# Patient Record
Sex: Male | Born: 1955 | Race: White | Hispanic: No | Marital: Married | Smoking: Former smoker
Health system: Southern US, Community
[De-identification: ages and names within clinical notes are randomized; demographics above are authoritative.]

## PROBLEM LIST (undated history)

## (undated) DIAGNOSIS — E8770 Fluid overload, unspecified: Secondary | ICD-10-CM

## (undated) DIAGNOSIS — I4891 Unspecified atrial fibrillation: Secondary | ICD-10-CM

## (undated) DIAGNOSIS — N289 Disorder of kidney and ureter, unspecified: Secondary | ICD-10-CM

## (undated) DIAGNOSIS — I10 Essential (primary) hypertension: Secondary | ICD-10-CM

## (undated) DIAGNOSIS — N186 End stage renal disease: Secondary | ICD-10-CM

## (undated) DIAGNOSIS — E039 Hypothyroidism, unspecified: Secondary | ICD-10-CM

## (undated) DIAGNOSIS — Z992 Dependence on renal dialysis: Secondary | ICD-10-CM

## (undated) HISTORY — PX: OTHER SURGICAL HISTORY: SHX169

## (undated) HISTORY — PX: APPENDECTOMY: SHX54

## (undated) HISTORY — PX: TONSILLECTOMY: SUR1361

---

## 2017-11-28 DIAGNOSIS — E8770 Fluid overload, unspecified: Secondary | ICD-10-CM

## 2017-11-28 HISTORY — DX: Fluid overload, unspecified: E87.70

## 2017-12-21 ENCOUNTER — Encounter (HOSPITAL_COMMUNITY): Payer: Self-pay | Admitting: Emergency Medicine

## 2017-12-21 ENCOUNTER — Observation Stay (HOSPITAL_COMMUNITY)
Admission: EM | Admit: 2017-12-21 | Discharge: 2017-12-23 | Disposition: A | Discharge status: Home / Self Care | Payer: Medicare Other | Attending: Internal Medicine | Admitting: Internal Medicine

## 2017-12-21 ENCOUNTER — Emergency Department (HOSPITAL_COMMUNITY): Payer: Medicare Other

## 2017-12-21 ENCOUNTER — Other Ambulatory Visit: Payer: Self-pay

## 2017-12-21 DIAGNOSIS — J811 Chronic pulmonary edema: Secondary | ICD-10-CM | POA: Insufficient documentation

## 2017-12-21 DIAGNOSIS — N400 Enlarged prostate without lower urinary tract symptoms: Secondary | ICD-10-CM | POA: Diagnosis not present

## 2017-12-21 DIAGNOSIS — Z79899 Other long term (current) drug therapy: Secondary | ICD-10-CM | POA: Insufficient documentation

## 2017-12-21 DIAGNOSIS — E8770 Fluid overload, unspecified: Secondary | ICD-10-CM | POA: Diagnosis present

## 2017-12-21 DIAGNOSIS — J9601 Acute respiratory failure with hypoxia: Secondary | ICD-10-CM | POA: Diagnosis not present

## 2017-12-21 DIAGNOSIS — I120 Hypertensive chronic kidney disease with stage 5 chronic kidney disease or end stage renal disease: Secondary | ICD-10-CM | POA: Diagnosis not present

## 2017-12-21 DIAGNOSIS — N186 End stage renal disease: Secondary | ICD-10-CM

## 2017-12-21 DIAGNOSIS — I10 Essential (primary) hypertension: Secondary | ICD-10-CM | POA: Diagnosis present

## 2017-12-21 DIAGNOSIS — N2581 Secondary hyperparathyroidism of renal origin: Secondary | ICD-10-CM | POA: Insufficient documentation

## 2017-12-21 DIAGNOSIS — Z888 Allergy status to other drugs, medicaments and biological substances status: Secondary | ICD-10-CM | POA: Insufficient documentation

## 2017-12-21 DIAGNOSIS — J810 Acute pulmonary edema: Secondary | ICD-10-CM | POA: Diagnosis present

## 2017-12-21 DIAGNOSIS — Z885 Allergy status to narcotic agent status: Secondary | ICD-10-CM | POA: Insufficient documentation

## 2017-12-21 DIAGNOSIS — R011 Cardiac murmur, unspecified: Secondary | ICD-10-CM | POA: Insufficient documentation

## 2017-12-21 DIAGNOSIS — D631 Anemia in chronic kidney disease: Secondary | ICD-10-CM | POA: Insufficient documentation

## 2017-12-21 DIAGNOSIS — E875 Hyperkalemia: Secondary | ICD-10-CM | POA: Diagnosis present

## 2017-12-21 DIAGNOSIS — D649 Anemia, unspecified: Secondary | ICD-10-CM

## 2017-12-21 DIAGNOSIS — E039 Hypothyroidism, unspecified: Secondary | ICD-10-CM | POA: Diagnosis not present

## 2017-12-21 DIAGNOSIS — E8779 Other fluid overload: Secondary | ICD-10-CM

## 2017-12-21 DIAGNOSIS — I480 Paroxysmal atrial fibrillation: Secondary | ICD-10-CM | POA: Diagnosis not present

## 2017-12-21 DIAGNOSIS — Z992 Dependence on renal dialysis: Secondary | ICD-10-CM | POA: Diagnosis not present

## 2017-12-21 DIAGNOSIS — R0902 Hypoxemia: Secondary | ICD-10-CM

## 2017-12-21 HISTORY — DX: Fluid overload, unspecified: E87.70

## 2017-12-21 HISTORY — DX: Disorder of kidney and ureter, unspecified: N28.9

## 2017-12-21 HISTORY — DX: Dependence on renal dialysis: Z99.2

## 2017-12-21 HISTORY — DX: Essential (primary) hypertension: I10

## 2017-12-21 HISTORY — DX: End stage renal disease: N18.6

## 2017-12-21 HISTORY — DX: Unspecified atrial fibrillation: I48.91

## 2017-12-21 HISTORY — DX: Hypothyroidism, unspecified: E03.9

## 2017-12-21 LAB — CBC WITH DIFFERENTIAL/PLATELET
Abs Immature Granulocytes: 0 10*3/uL (ref 0.0–0.1)
BASOS ABS: 0 10*3/uL (ref 0.0–0.1)
BASOS PCT: 1 %
EOS ABS: 0.2 10*3/uL (ref 0.0–0.7)
Eosinophils Relative: 4 %
HCT: 25.9 % — ABNORMAL LOW (ref 39.0–52.0)
Hemoglobin: 8.5 g/dL — ABNORMAL LOW (ref 13.0–17.0)
IMMATURE GRANULOCYTES: 0 %
LYMPHS ABS: 1.3 10*3/uL (ref 0.7–4.0)
Lymphocytes Relative: 27 %
MCH: 31.1 pg (ref 26.0–34.0)
MCHC: 32.8 g/dL (ref 30.0–36.0)
MCV: 94.9 fL (ref 78.0–100.0)
Monocytes Absolute: 0.5 10*3/uL (ref 0.1–1.0)
Monocytes Relative: 10 %
NEUTROS PCT: 58 %
Neutro Abs: 2.7 10*3/uL (ref 1.7–7.7)
Platelets: 193 10*3/uL (ref 150–400)
RBC: 2.73 MIL/uL — AB (ref 4.22–5.81)
RDW: 13.5 % (ref 11.5–15.5)
WBC: 4.7 10*3/uL (ref 4.0–10.5)

## 2017-12-21 LAB — BASIC METABOLIC PANEL
Anion gap: 19 — ABNORMAL HIGH (ref 5–15)
BUN: 58 mg/dL — AB (ref 6–20)
CHLORIDE: 92 mmol/L — AB (ref 101–111)
CO2: 26 mmol/L (ref 22–32)
Calcium: 9.2 mg/dL (ref 8.9–10.3)
Creatinine, Ser: 10.08 mg/dL — ABNORMAL HIGH (ref 0.61–1.24)
GFR calc Af Amer: 6 mL/min — ABNORMAL LOW (ref >60)
GFR calc non Af Amer: 5 mL/min — ABNORMAL LOW (ref >60)
Glucose, Bld: 86 mg/dL (ref 65–99)
POTASSIUM: 5.3 mmol/L — AB (ref 3.5–5.1)
SODIUM: 137 mmol/L (ref 135–145)

## 2017-12-21 NOTE — ED Triage Notes (Signed)
Pt came here from [place] this past Thursday and has been feeling SOB since he got here. Pt is a dialysis pt and receives it TTS. Pt is due for tomorrow and has a place to be treated. Pt is unsure if he "over did it" today while running errands and wanted to see if he needed treatment today.

## 2017-12-21 NOTE — ED Provider Notes (Signed)
Patient placed in Quick Look pathway, seen and evaluated/  Chief Complaint: shortness of breath  HPI: Pedro Anderson is a 62 y.o. male who presents to the ED for shortness of breath. [place] [facility] [place] and since arriving her 12/02/17 patient has had problems with feeling short of breath. Today the symptoms worsened. Patient is a dialysis patient and does not have dialysis again until tomorrow. Today patient was out and about a lot when the breathing got worse.   ROS: Resp: shortness of breath  Physical Exam:   BP (!) 165/74 (BP Location: Right Arm)   Pulse 64   Temp 98.1 F (36.7 C) (Oral)   Resp 16   Ht 6' (1.829 m)   Wt 90.5 kg (199 lb 8.3 oz)   SpO2 96%   BMI 27.06 kg/m   Gen: No distress  Neuro: Awake and Alert  Skin: Warm anddry  Lungs: rales RLL  Heart: regular rate and rhythm      Initiation of care has begun. The patient has been counseled on the process, plan, and necessity for staying for the completion/evaluation, and the remainder of the medical screening examination    Janne Napoleoneese, Ina Scrivens M, NP 12/21/17 2027    Melene PlanFloyd, Dan, DO 12/21/17 2324

## 2017-12-21 NOTE — ED Provider Notes (Signed)
[facility] EMERGENCY DEPARTMENT Provider Note  CSN: [id number] Arrival date & time: 12/21/17 1830  Chief Complaint(s) Shortness of Breath  HPI Pedro Anderson is a 62 y.o. male with ESRD on HD TTS. Last HD on Sat.  The history is provided by the patient.  Shortness of Breath  This is a recurrent problem. Duration: 20 days. The problem occurs intermittently.Progression since onset: worsened today. Associated symptoms include cough and leg swelling. Pertinent negatives include no fever, no coryza, no rhinorrhea and no sputum production. Precipitated by: allergic to fragrance.    Past Medical History Past Medical History:  Diagnosis Date  . A-fib (HCC)   . Hypertension   . Renal disorder    There are no active problems to display for this patient.  Home Medication(s) Prior to Admission medications   Medication Sig Start Date End Date Taking? Authorizing Provider  calcium acetate (PHOSLO) 667 MG capsule Take 667 mg by mouth 3 (three) times daily with meals.   Yes [provider]  carvedilol (COREG) 12.5 MG tablet Take 12.5 mg by mouth 2 (two) times daily with a meal.   Yes [provider]  doxazosin (CARDURA) 4 MG tablet Take 4 mg by mouth 2 (two) times daily.   Yes [provider]  folic acid-vitamin b complex-vitamin c-selenium-zinc (DIALYVITE) 3 MG TABS tablet Take 1 tablet by mouth every morning.   Yes [provider]  levothyroxine (SYNTHROID, LEVOTHROID) 125 MCG tablet Take 125 mcg by mouth daily before breakfast.   Yes [provider]  minoxidil (LONITEN) 2.5 MG tablet Take 2.5 mg by mouth 2 (two) times daily.   Yes [provider]  verapamil (CALAN-SR) 240 MG CR tablet Take 180 mg by mouth 2 (two) times daily.    Yes [provider]                                                                                                                                    Past Surgical History History  reviewed. No pertinent surgical history. Family History History reviewed. No pertinent family history.  Social History Social History   Tobacco Use  . Smoking status: Never Smoker  . Smokeless tobacco: Never Used  Substance Use Topics  . Alcohol use: Yes    Comment: occ  . Drug use: Never   Allergies Morphine and related; Purell instant hand [alcohol]; Saline flush [sodium chloride]; and Tape  Review of Systems Review of Systems  Constitutional: Negative for fever.  HENT: Negative for rhinorrhea.   Respiratory: Positive for cough and shortness of breath. Negative for sputum production.   Cardiovascular: Positive for leg swelling.   All other systems are reviewed and are negative for acute change except as noted in the HPI  Physical Exam Vital Signs  I have reviewed the triage vital signs BP (!) 174/74   Pulse 66   Temp 98.4 F (36.9 C)  Resp 17   Ht 6' (1.829 m)   Wt 90.5 kg (199 lb 8.3 oz)   SpO2 92% Ra   BMI 27.06 kg/m   Physical Exam  Constitutional: He is oriented to person, place, and time. He appears well-developed and well-nourished. No distress.  HENT:  Head: Normocephalic and atraumatic.  Nose: Nose normal.  Eyes: Pupils are equal, round, and reactive to light. Conjunctivae and EOM are normal. Right eye exhibits no discharge. Left eye exhibits no discharge. No scleral icterus.  Neck: Normal range of motion. Neck supple.  Cardiovascular: Normal rate and regular rhythm. Exam reveals no gallop and no friction rub.  No murmur heard. Pulmonary/Chest: Effort normal. No stridor. No respiratory distress. He has rales in the right middle field, the left middle field and the left lower field.  Abdominal: Soft. He exhibits no distension. There is no tenderness.  Musculoskeletal: He exhibits no edema or tenderness.  1+BLE edema  Neurological: He is alert and oriented to person, place, and time.  Skin: Skin is warm and dry. No rash noted. He is not diaphoretic. No  erythema.  Psychiatric: He has a normal mood and affect.  Vitals reviewed.   ED Results and Treatments Labs (all labs ordered are listed, but only abnormal results are displayed) Labs Reviewed  CBC WITH DIFFERENTIAL/PLATELET - Abnormal; Notable for the following components:      Result Value   RBC 2.73 (*)    Hemoglobin 8.5 (*)    HCT 25.9 (*)    All other components within normal limits  BASIC METABOLIC PANEL - Abnormal; Notable for the following components:   Potassium 5.3 (*)    Chloride 92 (*)    BUN 58 (*)    Creatinine, Ser 10.08 (*)    GFR calc non Af Amer 5 (*)    GFR calc Af Amer 6 (*)    Anion gap 19 (*)    All other components within normal limits  POC OCCULT BLOOD, ED                                                                                                                         EKG  EKG Interpretation  Date/Time:  Monday December 21 2017 18:38:14 EDT Ventricular Rate:  67 PR Interval:  184 QRS Duration: 94 QT Interval:  408 QTC Calculation: 431 R Axis:   84 Text Interpretation:  Normal sinus rhythm Normal ECG NO STEMI No old tracing to compare Confirmed by Drema Pry [phone]) on 12/21/2017 10:54:08 PM      Radiology Dg Chest 2 View  Result Date: 12/21/2017 CLINICAL DATA:  Shortness of breath EXAM: CHEST - 2 VIEW COMPARISON:  None. FINDINGS: Small left pleural effusion with associated atelectasis. Mild interstitial pulmonary edema. Mild cardiomegaly. IMPRESSION: Mild cardiomegaly with small left pleural effusion and mild interstitial edema, likely CHF. Electronically Signed   By: Deatra Robinson M.D.   On: 12/21/2017 19:30   Pertinent labs & imaging results that were  available during my care of the patient were reviewed by me and considered in my medical decision making (see chart for details).  Medications Ordered in ED Medications - No data to display                                                                                                                                   Procedures Procedures  (including critical care time)  Medical Decision Making / ED Course I have reviewed the nursing notes for this encounter and the patient's prior records (if available in EHR or on provided paperwork).    Evidence of volume overload on exam.  Chest x-ray with evidence of pulmonary edema.  Patient satting 92% on room air and placed on 3 L nasal cannula.  Labs are reassuring with only mild hypokalemia at 5.1. No EKG changes.  Also noted that Hb was 8.5. Last noted was in 05/2017 of 13.  Patient denies any hematochezia or melena.  Hemoccult neg.  Discussed with Nephro who requested admission to medicine and will put the patient on list for dialysis in the morning given his oxygen requirement.   Final Clinical Impression(s) / ED Diagnoses Final diagnoses:  Other hypervolemia  Hypoxia  Acute pulmonary edema (HCC)  Anemia, unspecified type      This chart was dictated using voice recognition software.  Despite best efforts to proofread,  errors can occur which can change the documentation meaning.   Nira Connardama, Jasmin Trumbull Eduardo, MD 12/22/17 [phone]

## 2017-12-22 ENCOUNTER — Encounter (HOSPITAL_COMMUNITY): Payer: Self-pay | Admitting: Internal Medicine

## 2017-12-22 ENCOUNTER — Other Ambulatory Visit: Payer: Self-pay

## 2017-12-22 DIAGNOSIS — N186 End stage renal disease: Secondary | ICD-10-CM

## 2017-12-22 DIAGNOSIS — I10 Essential (primary) hypertension: Secondary | ICD-10-CM | POA: Diagnosis not present

## 2017-12-22 DIAGNOSIS — E039 Hypothyroidism, unspecified: Secondary | ICD-10-CM | POA: Diagnosis not present

## 2017-12-22 DIAGNOSIS — Z992 Dependence on renal dialysis: Secondary | ICD-10-CM | POA: Diagnosis not present

## 2017-12-22 DIAGNOSIS — E8770 Fluid overload, unspecified: Secondary | ICD-10-CM | POA: Diagnosis not present

## 2017-12-22 DIAGNOSIS — D649 Anemia, unspecified: Secondary | ICD-10-CM

## 2017-12-22 DIAGNOSIS — E875 Hyperkalemia: Secondary | ICD-10-CM | POA: Diagnosis not present

## 2017-12-22 DIAGNOSIS — J9601 Acute respiratory failure with hypoxia: Secondary | ICD-10-CM | POA: Diagnosis not present

## 2017-12-22 DIAGNOSIS — I480 Paroxysmal atrial fibrillation: Secondary | ICD-10-CM | POA: Diagnosis present

## 2017-12-22 DIAGNOSIS — I120 Hypertensive chronic kidney disease with stage 5 chronic kidney disease or end stage renal disease: Secondary | ICD-10-CM | POA: Diagnosis not present

## 2017-12-22 DIAGNOSIS — D631 Anemia in chronic kidney disease: Secondary | ICD-10-CM

## 2017-12-22 LAB — CBC
HEMATOCRIT: 25.8 % — AB (ref 39.0–52.0)
HEMOGLOBIN: 8.8 g/dL — AB (ref 13.0–17.0)
MCH: 31.1 pg (ref 26.0–34.0)
MCHC: 34.1 g/dL (ref 30.0–36.0)
MCV: 91.2 fL (ref 78.0–100.0)
Platelets: 163 10*3/uL (ref 150–400)
RBC: 2.83 MIL/uL — ABNORMAL LOW (ref 4.22–5.81)
RDW: 13.2 % (ref 11.5–15.5)
WBC: 5.2 10*3/uL (ref 4.0–10.5)

## 2017-12-22 LAB — BASIC METABOLIC PANEL
ANION GAP: 14 (ref 5–15)
BUN: 33 mg/dL — ABNORMAL HIGH (ref 6–20)
CO2: 24 mmol/L (ref 22–32)
Calcium: 8.9 mg/dL (ref 8.9–10.3)
Chloride: 97 mmol/L — ABNORMAL LOW (ref 98–111)
Creatinine, Ser: 6.45 mg/dL — ABNORMAL HIGH (ref 0.61–1.24)
GFR calc Af Amer: 10 mL/min — ABNORMAL LOW (ref >60)
GFR, EST NON AFRICAN AMERICAN: 8 mL/min — AB (ref >60)
GLUCOSE: 83 mg/dL (ref 70–99)
POTASSIUM: 3.8 mmol/L (ref 3.5–5.1)
Sodium: 135 mmol/L (ref 135–145)

## 2017-12-22 LAB — HIV ANTIBODY (ROUTINE TESTING W REFLEX): HIV Screen 4th Generation wRfx: NONREACTIVE

## 2017-12-22 LAB — POC OCCULT BLOOD, ED: Fecal Occult Bld: NEGATIVE

## 2017-12-22 LAB — MRSA PCR SCREENING: MRSA by PCR: NEGATIVE

## 2017-12-22 MED ORDER — CHLORHEXIDINE GLUCONATE CLOTH 2 % EX PADS
6.0000 | MEDICATED_PAD | Freq: Every day | CUTANEOUS | Status: DC
  Administered 2017-12-23: 6 via TOPICAL

## 2017-12-22 MED ORDER — CALCITRIOL 0.25 MCG PO CAPS: 2.0000 ug | ORAL_CAPSULE | ORAL | Status: DC

## 2017-12-22 MED ORDER — LEVOTHYROXINE SODIUM 25 MCG PO TABS
125.0000 ug | ORAL_TABLET | Freq: Every day | ORAL | Status: DC
  Administered 2017-12-23: 125 ug via ORAL
  Filled 2017-12-22 (×3): qty 1

## 2017-12-22 MED ORDER — HEPARIN SODIUM (PORCINE) 5000 UNIT/ML IJ SOLN
5000.0000 [IU] | Freq: Three times a day (TID) | INTRAMUSCULAR | Status: DC
  Administered 2017-12-22 – 2017-12-23 (×3): 5000 [IU] via SUBCUTANEOUS
  Filled 2017-12-22 (×3): qty 1

## 2017-12-22 MED ORDER — HYDROCORTISONE 0.5 % EX CREA
TOPICAL_CREAM | CUTANEOUS | Status: DC | PRN
  Administered 2017-12-22: 1 via TOPICAL
  Filled 2017-12-22 (×2): qty 28.35

## 2017-12-22 MED ORDER — HEPARIN SODIUM (PORCINE) 1000 UNIT/ML DIALYSIS
1000.0000 [IU] | INTRAMUSCULAR | Status: DC | PRN
  Filled 2017-12-22: qty 1

## 2017-12-22 MED ORDER — CARVEDILOL 12.5 MG PO TABS
12.5000 mg | ORAL_TABLET | Freq: Two times a day (BID) | ORAL | Status: DC
  Administered 2017-12-22 – 2017-12-23 (×2): 12.5 mg via ORAL
  Filled 2017-12-22 (×3): qty 1

## 2017-12-22 MED ORDER — ACETAMINOPHEN 325 MG PO TABS
ORAL_TABLET | ORAL | Status: AC
  Filled 2017-12-22: qty 2

## 2017-12-22 MED ORDER — HEPARIN SODIUM (PORCINE) 1000 UNIT/ML DIALYSIS
40.0000 [IU]/kg | Freq: Once | INTRAMUSCULAR | Status: AC
  Administered 2017-12-22: 3600 [IU] via INTRAVENOUS_CENTRAL
  Filled 2017-12-22: qty 4

## 2017-12-22 MED ORDER — DOXAZOSIN MESYLATE 4 MG PO TABS
4.0000 mg | ORAL_TABLET | Freq: Two times a day (BID) | ORAL | Status: DC
  Filled 2017-12-22: qty 1

## 2017-12-22 MED ORDER — VERAPAMIL HCL ER 180 MG PO TBCR
180.0000 mg | EXTENDED_RELEASE_TABLET | Freq: Two times a day (BID) | ORAL | Status: DC
  Administered 2017-12-22 – 2017-12-23 (×2): 180 mg via ORAL
  Filled 2017-12-22 (×4): qty 1

## 2017-12-22 MED ORDER — ACETAMINOPHEN 325 MG PO TABS: 650.0000 mg | ORAL_TABLET | Freq: Four times a day (QID) | ORAL | Status: DC | PRN

## 2017-12-22 MED ORDER — SODIUM CHLORIDE 0.9 % IV SOLN: 100.0000 mL | INTRAVENOUS | Status: DC | PRN

## 2017-12-22 MED ORDER — DARBEPOETIN ALFA 200 MCG/0.4ML IJ SOSY
200.0000 ug | PREFILLED_SYRINGE | INTRAMUSCULAR | Status: DC
  Administered 2017-12-22: 200 ug via INTRAVENOUS
  Filled 2017-12-22: qty 0.4

## 2017-12-22 MED ORDER — CALCIUM ACETATE (PHOS BINDER) 667 MG PO CAPS: 667.0000 mg | ORAL_CAPSULE | Freq: Three times a day (TID) | ORAL | Status: DC

## 2017-12-22 MED ORDER — DOXAZOSIN MESYLATE 4 MG PO TABS
4.0000 mg | ORAL_TABLET | Freq: Every day | ORAL | Status: DC
  Filled 2017-12-22 (×2): qty 1

## 2017-12-22 MED ORDER — CHLORHEXIDINE GLUCONATE CLOTH 2 % EX PADS
6.0000 | MEDICATED_PAD | Freq: Every day | CUTANEOUS | Status: DC
  Administered 2017-12-22: 6 via TOPICAL

## 2017-12-22 MED ORDER — HEPARIN SODIUM (PORCINE) 1000 UNIT/ML DIALYSIS
100.0000 [IU]/kg | INTRAMUSCULAR | Status: DC | PRN
  Filled 2017-12-22: qty 10

## 2017-12-22 MED ORDER — DIALYVITE 3000 3 MG PO TABS: 1.0000 | ORAL_TABLET | ORAL | Status: DC

## 2017-12-22 MED ORDER — ALTEPLASE 2 MG IJ SOLR
2.0000 mg | Freq: Once | INTRAMUSCULAR | Status: DC | PRN
  Filled 2017-12-22: qty 2

## 2017-12-22 MED ORDER — ONDANSETRON HCL 4 MG PO TABS: 4.0000 mg | ORAL_TABLET | Freq: Four times a day (QID) | ORAL | Status: DC | PRN

## 2017-12-22 MED ORDER — LIDOCAINE-PRILOCAINE 2.5-2.5 % EX CREA: 1.0000 "application " | TOPICAL_CREAM | CUTANEOUS | Status: DC | PRN

## 2017-12-22 MED ORDER — DARBEPOETIN ALFA 200 MCG/0.4ML IJ SOSY
PREFILLED_SYRINGE | INTRAMUSCULAR | Status: AC
  Filled 2017-12-22: qty 0.4

## 2017-12-22 MED ORDER — HYDRALAZINE HCL 20 MG/ML IJ SOLN
INTRAMUSCULAR | Status: AC
  Filled 2017-12-22: qty 1

## 2017-12-22 MED ORDER — PENTAFLUOROPROP-TETRAFLUOROETH EX AERO: 1.0000 "application " | INHALATION_SPRAY | CUTANEOUS | Status: DC | PRN

## 2017-12-22 MED ORDER — LIDOCAINE HCL (PF) 1 % IJ SOLN
5.0000 mL | INTRAMUSCULAR | Status: DC | PRN
  Filled 2017-12-22: qty 5

## 2017-12-22 MED ORDER — POLYETHYLENE GLYCOL 3350 17 G PO PACK: 17.0000 g | PACK | Freq: Every day | ORAL | Status: DC | PRN

## 2017-12-22 MED ORDER — HYDRALAZINE HCL 20 MG/ML IJ SOLN
5.0000 mg | INTRAMUSCULAR | Status: DC | PRN
  Administered 2017-12-22: 5 mg via INTRAVENOUS

## 2017-12-22 MED ORDER — MINOXIDIL 2.5 MG PO TABS
2.5000 mg | ORAL_TABLET | Freq: Two times a day (BID) | ORAL | Status: DC
  Administered 2017-12-22 – 2017-12-23 (×2): 2.5 mg via ORAL
  Filled 2017-12-22 (×3): qty 1

## 2017-12-22 MED ORDER — CALCIUM ACETATE (PHOS BINDER) 667 MG PO CAPS
1334.0000 mg | ORAL_CAPSULE | Freq: Three times a day (TID) | ORAL | Status: DC
  Filled 2017-12-22 (×4): qty 2

## 2017-12-22 MED ORDER — RENA-VITE PO TABS
1.0000 | ORAL_TABLET | Freq: Every day | ORAL | Status: DC
  Filled 2017-12-22 (×2): qty 1

## 2017-12-22 MED ORDER — ACETAMINOPHEN 650 MG RE SUPP: 650.0000 mg | Freq: Four times a day (QID) | RECTAL | Status: DC | PRN

## 2017-12-22 MED ORDER — ONDANSETRON HCL 4 MG/2ML IJ SOLN: 4.0000 mg | Freq: Four times a day (QID) | INTRAMUSCULAR | Status: DC | PRN

## 2017-12-22 NOTE — ED Notes (Signed)
Contacted patient placement to get pt a room

## 2017-12-22 NOTE — Progress Notes (Signed)
1.7L off with HD overnight, breathing OK on nasal O2 but still +orthopnea/ cough w/ lying down.  Plan HD again today, 2.5 L UF over 3.5 hours.  Will reassess again in am to see if needs further HD while here.    Vinson Moselleob Chinara Hertzberg MD [facility] pgr [phone]   12/22/2017, 12:27 PM

## 2017-12-22 NOTE — Care Management Note (Addendum)
Case Management Note  Patient Details  Name: Pedro PatienceCarl [place] MRN: [id number] Date of Birth: 12/26/1955  Subjective/Objective:   Presents with volume overload, hx of paroxysmal atrial fibrillation, ESRD on Tuesday Thursday Saturday dialysis in [place], hypertension, hypothyroidism, BPH. Lives in [place] with wife, here in [place] visiting family.  PTA independent with ADL's per pt , no DME usage. Pt states once discharges plan is to [place] [facility] [place].                Corky DownsGayle Plant (Spouse)     [phone]        PCP: Dr. Alvin CritchleyWinkle [phone]([place]),[phone]  Action/Plan: Transition to home when medically stable. No present needs identified per NCM.  Expected Discharge Date:             Expected Discharge Plan:  Home/Self Care(Resides with wife, Inocencio HomesGayle)  In-House Referral:     Discharge planning Services  CM Consult  Post Acute Care Choice:    Choice offered to:     DME Arranged:   N/A DME Agency:   N/A  HH Arranged:   N/A HH Agency:   N/A  Status of Service:  Completed, signed off  If discussed at Long Length of Stay Meetings, dates discussed:    Additional Comments:  Epifanio LeschesCole, Davan Hark Hudson, RN 12/22/2017, 11:44 AM

## 2017-12-22 NOTE — Progress Notes (Addendum)
Patient alert and oriented x4. Upon giving morning medications, patient stated that he only takes his levothyroxine at 2am and refused to take it this morning at 0825. Blood pressure was 120/50. Patient also refused the 2nd pill of PHOSLO stating that he only takes 667mg  at meal and snack times. RN (CB) made aware of all above information.   Writer was present with student nurse for medication pass this morning and concurs with assessment.

## 2017-12-22 NOTE — Progress Notes (Signed)
PROGRESS NOTE    Pedro Anderson  WUJ:[id number] DOB: [birth year] DOA: 12/21/2017 PCP: Patient, No Pcp Per    Brief Narrative:  62 year old with past medical history relevant for paroxysmal atrial fibrillation, ESRD on Tuesday Thursday Saturday dialysis in [place], hypertension, hypothyroidism, BPH who is visiting from [place] because his brother-in-law died and developed acute hypoxic respiratory failure due to fluid overload.   Assessment & Plan:   Principal Problem:   Fluid overload Active Problems:   Hypertension   Hypothyroidism   ESRD on dialysis (HCC)   Acute respiratory failure with hypoxia (HCC)   Hyperkalemia   Anemia   PAF (paroxysmal atrial fibrillation) (HCC)   #) Acute hypoxic respiratory failure due to fluid overload in the setting of ESRD: Patient received dialysis yesterday and is planning to have another role of dialysis today. -Reports dry weight is approximately 89 kg -Nephrology consulted plan for dialysis today -Can likely be discharged after dialysis next line-wean oxygen as tolerated -Continue calcium acetate and multivitamin  #) Hypertension: -Continue minoxidil 2.5 mg twice daily -Continue verapamil 180 mg twice daily - Continue carvedilol 12.5 mg twice daily  #) Paroxysmal atrial fibrillation: Patient is not on anticoagulation for unclear reasons. -Defer to outpatient however warfarin would be the only option him  #) BPH: -Continue doxazosin 4 mg twice daily  #) Hypothyroidism: -Continue levothyroxine 125 mcg with breakfast  Fluids: Restrict Electrolytes: Monitor and supplement Nutrition: Renal diet  Prophylaxis: Subcu heparin  Disposition: Pending another round of dialysis and improved hypoxic respiratory failure  Full code  Consultants:   Nephrology  Procedures:  None  Antimicrobials:   None   Subjective: Patient reports he is doing somewhat better.  He continues to have orthopnea but reports that his breathing is better  when sitting up.  His edema is relatively unchanged in the lower extremities.  Denies any nausea, vomiting, diarrhea.  Objective: Vitals:   12/22/17 0500 12/22/17 0515 12/22/17 0545 12/22/17 0822  BP: (!) 174/70 (!) 168/75 (!) 173/66 (!) 120/50  Pulse: 77 83 86 83  Resp: 17 18 18    Temp:  98.1 F (36.7 C) 98.7 F (37.1 C) 98.8 F (37.1 C)  TempSrc:  Oral  Oral  SpO2: 96% 96% 93%   Weight:  88.7 kg (195 lb 8.8 oz)    Height:        Intake/Output Summary (Last 24 hours) at 12/22/2017 1043 Last data filed at 12/22/2017 0900 Gross per 24 hour  Intake 240 ml  Output 1730 ml  Net -1490 ml   Filed Weights   12/21/17 1858 12/22/17 0110 12/22/17 0515  Weight: 90.5 kg (199 lb 8.3 oz) 90.4 kg (199 lb 4.7 oz) 88.7 kg (195 lb 8.8 oz)    Examination:  General exam: Appears calm and comfortable  Respiratory system: Diminished lung sounds at bases, crackles at bases, no wheezes or rhonchi, no increased work of breathing Cardiovascular system: 2 out of 6 systolic murmur radiating from left upper extremity fistula Gastrointestinal system: Abdomen is nondistended, soft and nontender. No organomegaly or masses felt. Normal bowel sounds heard. Central nervous system: Alert and oriented. No focal neurological deficits. 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 Mood & affect appropriate.     Data Reviewed: I have personally reviewed following labs and imaging studies  CBC: Recent Labs  Lab 12/21/17 1927 12/22/17 0647  WBC 4.7 5.2  NEUTROABS 2.7  --   HGB  8.5* 8.8*  HCT 25.9* 25.8*  MCV 94.9 91.2  PLT 193 163   Basic Metabolic Panel: Recent Labs  Lab 12/21/17 1927 12/22/17 0647  NA 137 135  K 5.3* 3.8  CL 92* 97*  CO2 26 24  GLUCOSE 86 83  BUN 58* 33*  CREATININE 10.08* 6.45*  CALCIUM 9.2 8.9   GFR: Estimated Creatinine Clearance: 13.4 mL/min (A) (by C-G  formula based on SCr of 6.45 mg/dL (H)). Liver Function Tests: No results for input(s): AST, ALT, ALKPHOS, BILITOT, PROT, ALBUMIN in the last 168 hours. No results for input(s): LIPASE, AMYLASE in the last 168 hours. No results for input(s): AMMONIA in the last 168 hours. Coagulation Profile: No results for input(s): INR, PROTIME in the last 168 hours. Cardiac Enzymes: No results for input(s): CKTOTAL, CKMB, CKMBINDEX, TROPONINI in the last 168 hours. BNP (last 3 results) No results for input(s): PROBNP in the last 8760 hours. HbA1C: No results for input(s): HGBA1C in the last 72 hours. CBG: No results for input(s): GLUCAP in the last 168 hours. Lipid Profile: No results for input(s): CHOL, HDL, LDLCALC, TRIG, CHOLHDL, LDLDIRECT in the last 72 hours. Thyroid Function Tests: No results for input(s): TSH, T4TOTAL, FREET4, T3FREE, THYROIDAB in the last 72 hours. Anemia Panel: No results for input(s): VITAMINB12, FOLATE, FERRITIN, TIBC, IRON, RETICCTPCT in the last 72 hours. Sepsis Labs: No results for input(s): PROCALCITON, LATICACIDVEN in the last 168 hours.  Recent Results (from the past 240 hour(s))  MRSA PCR Screening     Status: None   Collection Time: 12/22/17  6:50 AM  Result Value Ref Range Status   MRSA by PCR NEGATIVE NEGATIVE Final    Comment:        The GeneXpert MRSA Assay (FDA approved for NASAL specimens only), is one component of a comprehensive MRSA colonization surveillance program. It is not intended to diagnose MRSA infection nor to guide or monitor treatment for MRSA infections. Performed at [facility], [address] [place], [place] [address]          Radiology Studies: Dg Chest 2 View  Result Date: 12/21/2017 CLINICAL DATA:  Shortness of breath EXAM: CHEST - 2 VIEW COMPARISON:  None. FINDINGS: Small left pleural effusion with associated atelectasis. Mild interstitial pulmonary edema. Mild cardiomegaly. IMPRESSION: Mild cardiomegaly with  small left pleural effusion and mild interstitial edema, likely CHF. Electronically Signed   By: Deatra RobinsonKevin  Herman M.D.   On: 12/21/2017 19:30        Scheduled Meds: . calcitRIOL  2 mcg Oral QODAY  . calcium acetate  1,334 mg Oral TID WC  . carvedilol  12.5 mg Oral BID WC  . Chlorhexidine Gluconate Cloth  6 each Topical Q0600  . darbepoetin (ARANESP) injection - DIALYSIS  200 mcg Intravenous Q Tue-HD  . doxazosin  4 mg Oral Daily  . heparin  40 Units/kg Dialysis Once in dialysis  . heparin  5,000 Units Subcutaneous Q8H  . hydrALAZINE      . levothyroxine  125 mcg Oral QAC breakfast  . minoxidil  2.5 mg Oral BID  . multivitamin  1 tablet Oral QHS  . verapamil  180 mg Oral BID   Continuous Infusions: . sodium chloride    . sodium chloride       LOS: 0 days    Time spent: 35    Delaine LameShrey C Ayala Ribble, MD [facility]  If 7PM-7AM, please contact night-coverage [email or web address] Password [email or web address] 12/22/2017, 10:43 AM

## 2017-12-22 NOTE — Progress Notes (Signed)
Patient informed nurse he takes [place] at home once daily but orderd for BID. Text paged Dr Clyde LundborgNiu.

## 2017-12-22 NOTE — H&P (Signed)
History and Physical    Pedro Anderson DOB: 02/18/1956 DOA: 12/21/2017  Referring MD/NP/PA:   PCP: Patient, No Pcp Per   Patient coming from:  The patient is coming from home.  At baseline, pt is independent for most of ADL.   Chief Complaint: SOB  HPI: Pedro PatienceCarl Strahm is a 62 y.o. male with medical history significant of ESRD-HD (TTS), hypertension, hypothyroidism, PAF not on anticoagulants, who presents with shortness of breath.  Pt came from [place] on 6/5 for family visiting and he is [facility]. He state that he had full dialysis on Saturday. He developed shortness of breath today, which has been progressively getting worse.  Patient has dry cough, but no chest pain, fever or chills.  He has mild ankle edema.  Patient does not have nausea, vomiting, diarrhea, abdominal pain, symptoms of UTI or unilateral weakness.  ED Course: pt was found to have potassium 5.3, bicarbonate 26, creatinine 10.08, BUN 58, WBC 4.7, temperature normal, no tachycardia, no tachypnea, oxygen saturation 92% on room air.  Chest x-ray showed cardiomegaly and pulmonary edema.  Patient is placed on telemetry bed of observation.  Nephrology, Dr. Darrick Pennaeterding was consulted.  Review of Systems:   General: no fevers, chills, has fatigue HEENT: no blurry vision, hearing changes or sore throat Respiratory: has dyspnea, coughing, no wheezing CV: no chest pain, no palpitations GI: no nausea, vomiting, abdominal pain, diarrhea, constipation GU: no dysuria, burning on urination, increased urinary frequency, hematuria  Ext: has leg edema Neuro: no unilateral weakness, numbness, or tingling, no vision change or hearing loss Skin: no rash, no skin tear. MSK: No muscle spasm, no deformity, no limitation of range of movement in spin Heme: No easy bruising.  Travel history: No recent long distant travel.  Allergy:  Allergies  Allergen Reactions  . Morphine And Related   . Purell Instant  Hand [Alcohol]     Cardiac arrest. States it is not the alcohol but the fregrance  . Saline Flush [Sodium Chloride]     vomiting  . Tape     adhesives    Past Medical History:  Diagnosis Date  . A-fib (HCC)   . ESRD on dialysis (HCC)   . Hypertension   . Hypothyroidism   . Renal disorder     History reviewed. No pertinent surgical history.  Social History:  reports that he has never smoked. He has never used smokeless tobacco. He reports that he drinks alcohol. He reports that he does not use drugs.  Family History:  Family History  Problem Relation Age of Onset  . Lung cancer Mother   . Parkinson's disease Father      Prior to Admission medications   Medication Sig Start Date End Date Taking? Authorizing Provider  calcium acetate (PHOSLO) 667 MG capsule Take 667 mg by mouth 3 (three) times daily with meals.   Yes [provider]  carvedilol (COREG) 12.5 MG tablet Take 12.5 mg by mouth 2 (two) times daily with a meal.   Yes [provider]  doxazosin (CARDURA) 4 MG tablet Take 4 mg by mouth 2 (two) times daily.   Yes [provider]  folic acid-vitamin b complex-vitamin c-selenium-zinc (DIALYVITE) 3 MG TABS tablet Take 1 tablet by mouth every morning.   Yes [provider]  levothyroxine (SYNTHROID, LEVOTHROID) 125 MCG tablet Take 125 mcg by mouth daily before breakfast.   Yes [provider]  minoxidil (LONITEN) 2.5 MG tablet Take 2.5 mg by mouth  2 (two) times daily.   Yes [provider]  verapamil (CALAN-SR) 240 MG CR tablet Take 180 mg by mouth 2 (two) times daily.    Yes [provider]    Physical Exam: Vitals:   12/22/17 0415 12/22/17 0500 12/22/17 0515 12/22/17 0545  BP: (!) 186/81 (!) 174/70 (!) 168/75 (!) 173/66  Pulse: 78 77 83 86  Resp: 18 17 18 18   Temp:   98.1 F (36.7 C) 98.7 F (37.1 C)  TempSrc:   Oral   SpO2: 98% 96% 96% 93%  Weight:   88.7 kg (195 lb 8.8 oz)   Height:       General:  Not in acute distress HEENT:       Eyes: PERRL, EOMI, no scleral icterus.       ENT: No discharge from the ears and nose, no pharynx injection, no tonsillar enlargement.        Neck: No JVD, no bruit, no mass felt. Heme: No neck lymph node enlargement. Cardiac: S1/S2, RRR, No murmurs, No gallops or rubs. Respiratory: no rales, wheezing, rhonchi or rubs. GI: Soft, nondistended, nontender, no rebound pain, no organomegaly, BS present. GU: No hematuria Ext: has mild ankle edema bilaterally. 2+DP/PT pulse bilaterally. Musculoskeletal: No joint deformities, No joint redness or warmth, no limitation of ROM in spin. Skin: No rashes.  Neuro: Alert, oriented X3, cranial nerves II-XII grossly intact, moves all extremities normally. Psych: Patient is not psychotic, no suicidal or hemocidal ideation.  Labs on Admission: I have personally reviewed following labs and imaging studies  CBC: Recent Labs  Lab 12/21/17 1927  WBC 4.7  NEUTROABS 2.7  HGB 8.5*  HCT 25.9*  MCV 94.9  PLT 193   Basic Metabolic Panel: Recent Labs  Lab 12/21/17 1927  NA 137  K 5.3*  CL 92*  CO2 26  GLUCOSE 86  BUN 58*  CREATININE 10.08*  CALCIUM 9.2   GFR: Estimated Creatinine Clearance: 8.6 mL/min (A) (by C-G formula based on SCr of 10.08 mg/dL (H)). Liver Function Tests: No results for input(s): AST, ALT, ALKPHOS, BILITOT, PROT, ALBUMIN in the last 168 hours. No results for input(s): LIPASE, AMYLASE in the last 168 hours. No results for input(s): AMMONIA in the last 168 hours. Coagulation Profile: No results for input(s): INR, PROTIME in the last 168 hours. Cardiac Enzymes: No results for input(s): CKTOTAL, CKMB, CKMBINDEX, TROPONINI in the last 168 hours. BNP (last 3 results) No results for input(s): PROBNP in the last 8760 hours. HbA1C: No results for input(s): HGBA1C in the last 72 hours. CBG: No results for input(s): GLUCAP in the last 168 hours. Lipid Profile: No results for input(s): CHOL,  HDL, LDLCALC, TRIG, CHOLHDL, LDLDIRECT in the last 72 hours. Thyroid Function Tests: No results for input(s): TSH, T4TOTAL, FREET4, T3FREE, THYROIDAB in the last 72 hours. Anemia Panel: No results for input(s): VITAMINB12, FOLATE, FERRITIN, TIBC, IRON, RETICCTPCT in the last 72 hours. Urine analysis: No results found for: COLORURINE, APPEARANCEUR, LABSPEC, PHURINE, GLUCOSEU, HGBUR, BILIRUBINUR, KETONESUR, PROTEINUR, UROBILINOGEN, NITRITE, LEUKOCYTESUR Sepsis Labs: @LABRCNTIP (procalcitonin:4,lacticidven:4) )No results found for this or any previous visit (from the past 240 hour(s)).   Radiological Exams on Admission: Dg Chest 2 View  Result Date: 12/21/2017 CLINICAL DATA:  Shortness of breath EXAM: CHEST - 2 VIEW COMPARISON:  None. FINDINGS: Small left pleural effusion with associated atelectasis. Mild interstitial pulmonary edema. Mild cardiomegaly. IMPRESSION: Mild cardiomegaly with small left pleural effusion and mild interstitial edema, likely CHF. Electronically Signed   By: Caryn Bee  Chase Picket M.D.   On: 12/21/2017 19:30     EKG: Independently reviewed.  Sinus rhythm, no ischemic change.,  QTC 431    Assessment/Plan Principal Problem:   Fluid overload Active Problems:   Hypertension   Hypothyroidism   ESRD on dialysis (HCC)   Acute respiratory failure with hypoxia (HCC)   Hyperkalemia   Anemia   PAF (paroxysmal atrial fibrillation) (HCC)  Acute respiratory failure with hypoxia due to fluid overload: Patient's shortness of breath is due to fluid overload.  Chest x-ray showed pulmonary edema, consistent with fluid overload.  Renal, Dr. Darrick Penna was consulted. -Placed on telemetry bed for observation -Continue nasal cannula oxygen to maintain oxygen saturation above 92% -Hemodialysis per renal  HTN:  -Continue home medications: Coreg, minoxidil and verapamil -IV hydralazine prn  Hypothyroidism: Last TSH was not on record -Continue home Synthroid  ESRD on dialysis (TTS): no  has fluid overload and mild hyperkalemia with potassium 5.3 - Hemodialysis per renal  Mild Hyperkalemia: K=5.3. No EKG change -HD per renal  Anemia in ESRD: Hgb 13.0-->8.5 today. FOBT negaive. -Aranesp 200 mcg per Dr. Darrick Penna  PAF: CHA2DS2-VASc Score is 1 (HTN), not need oral anticoagulation. Heart rate is well controlled. -continue verapamil and Coreg   DVT ppx: SQ Heparin     Code Status: Full code Family Communication:  Yes, patient's  wife at bed side Disposition Plan:  Anticipate discharge back to previous home environment Consults called:  Renal, Dr. Darrick Penna Admission status: Obs / tele   Date of Service 12/22/2017    Lorretta Harp [facility] Pager [phone]  If 7PM-7AM, please contact night-coverage [email or web address] Password [facility] 12/22/2017, 6:40 AM

## 2017-12-22 NOTE — Progress Notes (Addendum)
Patient arrived to unit from HD. Alert, oriented x4. MD orders reviewed with patient..Patient informed nurse he takes synthroid at home 0200 am, coreg and cadura at 0800 and 1700. Communication sent to pharmacy for medication adjustment. Admission nurse notified of patient arrival to unit

## 2017-12-22 NOTE — Progress Notes (Signed)
PT sitting up in bed, MD in to speak with him and assess. Pt is scheduled for dialysis today - he had a session early this AM, ending around 530am. Pt's BP 120/50, holding BP meds in anticipation of dialysis. Pt shares that one of his doctors said he doesn't need "half the meds I'm on". Will monitor BP. MD aware of holding meds.

## 2017-12-22 NOTE — Progress Notes (Signed)
Pt. back from dialysis

## 2017-12-22 NOTE — ED Notes (Signed)
Nephrology at bedside

## 2017-12-22 NOTE — Progress Notes (Signed)
Pt requesting for CPAP. NP Craige CottaKirby text paged

## 2017-12-22 NOTE — Consult Note (Signed)
Reason for Consult:SOB Referring Physician: ED  Pedro Anderson is an 62 y.o. male.  HPI: 61 yr male with ESRD, ?? Related to ARB, who is on transient Hd at [facility], only here 1 wk so far, who presents with progressive SOB over past 24 h.  Has experiences SOB off and on since arrival.  Is on 4 diff bp meds.  Notes ankle edema, no CP, cough , fevers or chills or CP. Has been getting to dry at [facility]. Occ cramps.  Arrived at ED at 6 pm and we were called at 11:20 pm.  O2 sat RA 92, K 5.1.  No recurrent episodes of this.  On HD since 2012 Constitutional: as above Eyes: prob with vision Ears, nose, mouth, throat, and face: dry mouth, uses frozen grapes, no teeth Respiratory: as above, quit smoking almost 20 yr ago Cardiovascular: negative Gastrointestinal: negative Integument/breast: negative Hematologic/lymphatic: Hb 8.5 , not on ESA Neurological: negative Endocrine: negative Allergic/Immunologic: Tape adhesive, Purel hand cleaners, MS causes confusion  Endo hypothyroid    Dialyzes at [place] on TTS since 12/12/17. Primary Nephrologist  Colodonato.Marland Kitchen HD Bath 2/2.5, Dialyzer 180, Heparin strd. Access LUA AVF.  Past Medical History:  Diagnosis Date  . A-fib ([place])   . ESRD on dialysis ([place])   . Hypertension   . Hypothyroidism   . Renal disorder     History reviewed. No pertinent surgical history.  History reviewed. No pertinent family history.  Social History:  reports that he has never smoked. He has never used smokeless tobacco. He reports that he drinks alcohol. He reports that he does not use drugs.  Allergies:  Allergies  Allergen Reactions  . Morphine And Related   . Purell Instant Hand [Alcohol]     Cardiac arrest. States it is not the alcohol but the fregrance  . Saline Flush [Sodium Chloride]     vomiting  . Tape     adhesives    Medications: I have reviewed the patient's current medications. Prior to Admission:  (Not in a hospital admission)  Results for orders placed or  performed during the hospital encounter of 12/21/17 (from the past 48 hour(s))  CBC with Differential/Platelet     Status: Abnormal   Collection Time: 12/21/17  7:27 PM  Result Value Ref Range   WBC 4.7 4.0 - 10.5 K/uL   RBC 2.73 (L) 4.22 - 5.81 MIL/uL   Hemoglobin 8.5 (L) 13.0 - 17.0 g/dL   HCT 25.9 (L) 39.0 - 52.0 %   MCV 94.9 78.0 - 100.0 fL   MCH 31.1 26.0 - 34.0 pg   MCHC 32.8 30.0 - 36.0 g/dL   RDW 13.5 11.5 - 15.5 %   Platelets 193 150 - 400 K/uL   Neutrophils Relative % 58 %   Neutro Abs 2.7 1.7 - 7.7 K/uL   Lymphocytes Relative 27 %   Lymphs Abs 1.3 0.7 - 4.0 K/uL   Monocytes Relative 10 %   Monocytes Absolute 0.5 0.1 - 1.0 K/uL   Eosinophils Relative 4 %   Eosinophils Absolute 0.2 0.0 - 0.7 K/uL   Basophils Relative 1 %   Basophils Absolute 0.0 0.0 - 0.1 K/uL   Immature Granulocytes 0 %   Abs Immature Granulocytes 0.0 0.0 - 0.1 K/uL    Comment: Performed at [facility], [address], [place], [place] [address]  Basic metabolic panel     Status: Abnormal   Collection Time: 12/21/17  7:27 PM  Result Value Ref Range  Sodium 137 135 - 145 mmol/L   Potassium 5.3 (H) 3.5 - 5.1 mmol/L   Chloride 92 (L) 101 - 111 mmol/L   CO2 26 22 - 32 mmol/L   Glucose, Bld 86 65 - 99 mg/dL   BUN 58 (H) 6 - 20 mg/dL   Creatinine, Ser 10.08 (H) 0.61 - 1.24 mg/dL   Calcium 9.2 8.9 - 10.3 mg/dL   GFR calc non Af Amer 5 (L) >60 mL/min   GFR calc Af Amer 6 (L) >60 mL/min    Comment: (NOTE) The eGFR has been calculated using the CKD EPI equation. This calculation has not been validated in all clinical situations. eGFR's persistently <60 mL/min signify possible Chronic Kidney Disease.    Anion gap 19 (H) 5 - 15    Comment: Performed at [facility], [place] [address] [place], [place] [address]  POC occult blood, ED Provider will collect     Status: None   Collection Time: 12/22/17 12:16 AM  Result Value Ref Range   Fecal Occult Bld NEGATIVE NEGATIVE    Dg Chest 2  View  Result Date: 12/21/2017 CLINICAL DATA:  Shortness of breath EXAM: CHEST - 2 VIEW COMPARISON:  None. FINDINGS: Small left pleural effusion with associated atelectasis. Mild interstitial pulmonary edema. Mild cardiomegaly. IMPRESSION: Mild cardiomegaly with small left pleural effusion and mild interstitial edema, likely CHF. Electronically Signed   By: Ulyses Jarred M.D.   On: 12/21/2017 19:30    ROS Blood pressure (!) 172/73, pulse 68, temperature 98.4 F (36.9 C), resp. rate 17, height 6' (1.829 m), weight 90.5 kg (199 lb 8.3 oz), SpO2 96 %. Physical Exam Physical Examination: General appearance - alert, well appearing, and in no distress and NAD on O2 Mental status - alert, oriented to person, place, and time Eyes - pupils equal and reactive, extraocular eye movements intact Ears - bilateral TM's and external ear canals normal Mouth - edentulous Neck - adenopathy noted PCL Lymphatics - posterior cervical nodes Chest - dullness to percuss, BS decreased L base,  Rales throughout otherwise  RR 8 Heart - S1 and S2 normal, S4 present, systolic murmur LX7/2 at 2nd left intercostal space Abdomen - soft, pos bs, liver down 5 cm Extremities - pedal edema 2 +, bilat fem bruits,  AVF LUA Skin - pale  Assessment/Plan: 1 Volume overload, need lower dry.  O2 sat not bad but does need HD, not emergent.  Will do this am..  2 ESRD: Needs less BP meds, lower dry,   3 Hypertension: needs lower vol 4. Anemia of ESRD: needs ESA, check Fe 5. Metabolic Bone Disease: Needs vit D and ^ binder dose P HD, esa, lower dry, lower meds. ^ Ca Lamont Snowball Jerine Surles 12/22/2017, 12:38 AM

## 2017-12-23 DIAGNOSIS — N186 End stage renal disease: Secondary | ICD-10-CM

## 2017-12-23 DIAGNOSIS — Z992 Dependence on renal dialysis: Secondary | ICD-10-CM | POA: Diagnosis not present

## 2017-12-23 DIAGNOSIS — I120 Hypertensive chronic kidney disease with stage 5 chronic kidney disease or end stage renal disease: Secondary | ICD-10-CM | POA: Diagnosis not present

## 2017-12-23 LAB — CBC
HCT: 25.2 % — ABNORMAL LOW (ref 39.0–52.0)
Hemoglobin: 8.3 g/dL — ABNORMAL LOW (ref 13.0–17.0)
MCH: 30.7 pg (ref 26.0–34.0)
MCHC: 32.9 g/dL (ref 30.0–36.0)
MCV: 93.3 fL (ref 78.0–100.0)
Platelets: 149 10*3/uL — ABNORMAL LOW (ref 150–400)
RBC: 2.7 MIL/uL — ABNORMAL LOW (ref 4.22–5.81)
RDW: 13.3 % (ref 11.5–15.5)
WBC: 4.7 10*3/uL (ref 4.0–10.5)

## 2017-12-23 LAB — RENAL FUNCTION PANEL
Albumin: 3 g/dL — ABNORMAL LOW (ref 3.5–5.0)
Anion gap: 12 (ref 5–15)
BUN: 32 mg/dL — ABNORMAL HIGH (ref 6–20)
CO2: 29 mmol/L (ref 22–32)
Calcium: 8.8 mg/dL — ABNORMAL LOW (ref 8.9–10.3)
Chloride: 97 mmol/L — ABNORMAL LOW (ref 98–111)
Creatinine, Ser: 6.16 mg/dL — ABNORMAL HIGH (ref 0.61–1.24)
GFR calc Af Amer: 10 mL/min — ABNORMAL LOW (ref >60)
GFR calc non Af Amer: 9 mL/min — ABNORMAL LOW (ref >60)
Glucose, Bld: 89 mg/dL (ref 70–99)
Phosphorus: 5.2 mg/dL — ABNORMAL HIGH (ref 2.5–4.6)
Potassium: 4.9 mmol/L (ref 3.5–5.1)
Sodium: 138 mmol/L (ref 135–145)

## 2017-12-23 LAB — HEPATITIS B SURFACE ANTIBODY,QUALITATIVE: Hep B S Ab: REACTIVE

## 2017-12-23 LAB — HEPATITIS B SURFACE ANTIGEN: Hepatitis B Surface Ag: NEGATIVE

## 2017-12-23 LAB — MAGNESIUM: Magnesium: 2.2 mg/dL (ref 1.7–2.4)

## 2017-12-23 MED ORDER — CALCITRIOL 0.5 MCG PO CAPS: 2.0000 ug | ORAL_CAPSULE | ORAL | 1 refills | Status: AC

## 2017-12-23 NOTE — Discharge Summary (Signed)
[facility]  Physician Discharge Summary   Patient ID: Pedro Anderson MRN: [id number] DOB/AGE: [birth year] 62 y.o.  Admit date: 12/21/2017 Discharge date: 12/23/2017  PCP: Patient, No Pcp Per  DISCHARGE DIAGNOSES:  Pulmonary edema secondary to end-stage renal disease  RECOMMENDATIONS FOR OUTPATIENT FOLLOW UP: 1. Patient to continue with his usual outpatient dialysis schedule starting tomorrow   DISCHARGE CONDITION: fair  Diet recommendation: For  [facility] Weights   12/22/17 0515 12/22/17 1350 12/22/17 1732  Weight: 88.7 kg (195 lb 8.8 oz) 88.9 kg (195 lb 15.8 oz) 86.4 kg (190 lb 7.6 oz)    INITIAL HISTORY: 62 year old with past medical history relevant for paroxysmal atrial fibrillation, ESRD on Tuesday Thursday Saturday dialysis in [place], hypertension, hypothyroidism, BPH who is visiting from [place] because his brother-in-law died and developed acute hypoxic respiratory failure due to fluid overload.  Consultations:  Nephrology  Procedures:  Hemodialysis x2   HOSPITAL COURSE:   Acute hypoxic respiratory failure due to fluid overload in the setting of ESRD Patient was dialyzed twice.  Symptoms have resolved.  His dyspnea has resolved.  Discussed with nephrology.  They feel like patient needs lower dry weight target due to recent weight loss.  Continue home medications.  He will resume his outpatient dialysis starting tomorrow.  Essential hypertension Continue home medications  Paroxysmal atrial fibrillation Patient is not on anticoagulation for unclear reasons.  We can discuss this with his outpatient providers.  He is visiting from [place].  BPH Continue doxazosin  Hypothyroidism Continue levothyroxine 125 mcg with breakfast  Overall stable.  Much improved.  Okay for discharge today.  Plans to return to [place] in July.    PERTINENT LABS:  The results of significant diagnostics from this hospitalization (including imaging, microbiology,  ancillary and laboratory) are listed below for reference.    Microbiology: Recent Results (from the past 240 hour(s))  MRSA PCR Screening     Status: None   Collection Time: 12/22/17  6:50 AM  Result Value Ref Range Status   MRSA by PCR NEGATIVE NEGATIVE Final    Comment:        The GeneXpert MRSA Assay (FDA approved for NASAL specimens only), is one component of a comprehensive MRSA colonization surveillance program. It is not intended to diagnose MRSA infection nor to guide or monitor treatment for MRSA infections. Performed at [facility], [address], [place], [place] [address]      Labs: Basic Metabolic Panel: Recent Labs  Lab 12/21/17 1927 12/22/17 0647 12/23/17 0639  NA 137 135 138  K 5.3* 3.8 4.9  CL 92* 97* 97*  CO2 26 24 29   GLUCOSE 86 83 89  BUN 58* 33* 32*  CREATININE 10.08* 6.45* 6.16*  CALCIUM 9.2 8.9 8.8*  MG  --   --  2.2  PHOS  --   --  5.2*   Liver Function Tests: Recent Labs  Lab 12/23/17 0639  ALBUMIN 3.0*   CBC: Recent Labs  Lab 12/21/17 1927 12/22/17 0647 12/23/17 0639  WBC 4.7 5.2 4.7  NEUTROABS 2.7  --   --   HGB 8.5* 8.8* 8.3*  HCT 25.9* 25.8* 25.2*  MCV 94.9 91.2 93.3  PLT 193 163 149*    IMAGING STUDIES Dg Chest 2 View  Result Date: 12/21/2017 CLINICAL DATA:  Shortness of breath EXAM: CHEST - 2 VIEW COMPARISON:  None. FINDINGS: Small left pleural effusion with associated atelectasis. Mild interstitial pulmonary edema. Mild cardiomegaly. IMPRESSION: Mild cardiomegaly with small left pleural effusion and  mild interstitial edema, likely CHF. Electronically Signed   By: Deatra RobinsonKevin  Herman M.D.   On: 12/21/2017 19:30    DISCHARGE EXAMINATION: Vitals:   12/22/17 2301 12/23/17 0501 12/23/17 0505 12/23/17 0846  BP:  (!) 153/69  (!) 164/68  Pulse: 77 69  70  Resp: 16 16    Temp:  (!) 97 F (36.1 C) 98.5 F (36.9 C)   TempSrc:  Axillary Oral   SpO2: 96% 96%    Weight:      Height:       General appearance: alert,  cooperative, appears stated age and no distress Resp: clear to auscultation bilaterally Cardio: regular rate and rhythm, S1, S2 normal, no murmur, click, rub or gallop GI: soft, non-tender; bowel sounds normal; no masses,  no organomegaly Extremities: extremities normal, atraumatic, no cyanosis or edema Neurologic: No focal deficits  DISPOSITION: Home  Discharge Instructions    Call MD for:  difficulty breathing, headache or visual disturbances   Complete by:  As directed    Call MD for:  extreme fatigue   Complete by:  As directed    Call MD for:  persistant dizziness or light-headedness   Complete by:  As directed    Call MD for:  persistant nausea and vomiting   Complete by:  As directed    Call MD for:  severe uncontrolled pain   Complete by:  As directed    Call MD for:  temperature >100.4   Complete by:  As directed    Discharge instructions   Complete by:  As directed    Please go for dialysis as per your usual schedule starting tomorrow June 27.  You were cared for by a hospitalist during your hospital stay. If you have any questions about your discharge medications or the care you received while you were in the hospital after you are discharged, you can call the unit and asked to speak with the hospitalist on call if the hospitalist that took care of you is not available. Once you are discharged, your primary care physician will handle any further medical issues. Please note that NO REFILLS for any discharge medications will be authorized once you are discharged, as it is imperative that you return to your primary care physician (or establish a relationship with a primary care physician if you do not have one) for your aftercare needs so that they can reassess your need for medications and monitor your lab values. If you do not have a primary care physician, you can call [phone] for a physician referral.   Increase activity slowly   Complete by:  As directed          Allergies as of 12/23/2017      Reactions   Benzoic Acid    Morphine And Related    Purell Instant Hand [alcohol]    Cardiac arrest. States it is not the alcohol but the fregrance   Saline Flush [sodium Chloride]    vomiting   Tape    adhesives      Medication List    TAKE these medications   calcitRIOL 0.5 MCG capsule Commonly known as:  ROCALTROL Take 4 capsules (2 mcg total) by mouth every other day.   carvedilol 12.5 MG tablet Commonly known as:  COREG Take 12.5 mg by mouth 2 (two) times daily with a meal.   doxazosin 4 MG tablet Commonly known as:  CARDURA Take 4 mg by mouth 2 (two) times daily.   folic acid-vitamin  b complex-vitamin c-selenium-zinc 3 MG Tabs tablet Take 1 tablet by mouth every morning.   levothyroxine 125 MCG tablet Commonly known as:  SYNTHROID, LEVOTHROID Take 125 mcg by mouth daily before breakfast.   minoxidil 2.5 MG tablet Commonly known as:  LONITEN Take 2.5 mg by mouth 2 (two) times daily.   PHOSLO 667 MG capsule Generic drug:  calcium acetate Take 667 mg by mouth 3 (three) times daily with meals.   verapamil 240 MG CR tablet Commonly known as:  CALAN-SR Take 180 mg by mouth 2 (two) times daily.          TOTAL DISCHARGE TIME: 35 mins  Osvaldo Shipper  [facility] Pager [phone]  12/23/2017, 2:46 PM

## 2017-12-23 NOTE — Progress Notes (Signed)
Discharge instructions given to patient.  Patient verbalized understanding of discharge instructions and signed them.  PIV removed and area CDI.   Discharge vitals Vitals:   12/23/17 0505 12/23/17 0846  BP:  (!) 164/68  Pulse:  70  Resp:    Temp: 98.5 F (36.9 C)   SpO2:     Discharge medications Allergies as of 12/23/2017      Reactions   Benzoic Acid    Morphine And Related    Purell Instant Hand [alcohol]    Cardiac arrest. States it is not the alcohol but the fregrance   Saline Flush [sodium Chloride]    vomiting   Tape    adhesives      Medication List    TAKE these medications   calcitRIOL 0.5 MCG capsule Commonly known as:  ROCALTROL Take 4 capsules (2 mcg total) by mouth every other day.   carvedilol 12.5 MG tablet Commonly known as:  COREG Take 12.5 mg by mouth 2 (two) times daily with a meal.   doxazosin 4 MG tablet Commonly known as:  CARDURA Take 4 mg by mouth 2 (two) times daily.   folic acid-vitamin b complex-vitamin c-selenium-zinc 3 MG Tabs tablet Take 1 tablet by mouth every morning.   levothyroxine 125 MCG tablet Commonly known as:  SYNTHROID, LEVOTHROID Take 125 mcg by mouth daily before breakfast.   minoxidil 2.5 MG tablet Commonly known as:  LONITEN Take 2.5 mg by mouth 2 (two) times daily.   PHOSLO 667 MG capsule Generic drug:  calcium acetate Take 667 mg by mouth 3 (three) times daily with meals.   verapamil 240 MG CR tablet Commonly known as:  CALAN-SR Take 180 mg by mouth 2 (two) times daily.      Patient escorted to family members car via wheelchair. Pedro Anderson

## 2017-12-23 NOTE — Progress Notes (Addendum)
  [facility] Progress Note   Subjective:  Seen in room. Dyspnea much improved with HD yesterday. No CP or new symptoms. His main complaints are regarding the food here.   Objective Vitals:   12/22/17 2301 12/23/17 0501 12/23/17 0505 12/23/17 0846  BP:  (!) 153/69  (!) 164/68  Pulse: 77 69  70  Resp: 16 16    Temp:  (!) 97 F (36.1 C) 98.5 F (36.9 C)   TempSrc:  Axillary Oral   SpO2: 96% 96%    Weight:      Height:       Physical Exam General: Well appearing, NAD Heart: RRR Lungs: CTAB Extremities: No LE edema Dialysis Access:  AVF  Additional Objective Labs: Basic Metabolic Panel: Recent Labs  Lab 12/21/17 1927 12/22/17 0647 12/23/17 0639  NA 137 135 138  K 5.3* 3.8 4.9  CL 92* 97* 97*  CO2 26 24 29   GLUCOSE 86 83 89  BUN 58* 33* 32*  CREATININE 10.08* 6.45* 6.16*  CALCIUM 9.2 8.9 8.8*  PHOS  --   --  5.2*   Liver Function Tests: Recent Labs  Lab 12/23/17 0639  ALBUMIN 3.0*   CBC: Recent Labs  Lab 12/21/17 1927 12/22/17 0647 12/23/17 0639  WBC 4.7 5.2 4.7  NEUTROABS 2.7  --   --   HGB 8.5* 8.8* 8.3*  HCT 25.9* 25.8* 25.2*  MCV 94.9 91.2 93.3  PLT 193 163 149*   Studies/Results: Dg Chest 2 View  Result Date: 12/21/2017 CLINICAL DATA:  Shortness of breath EXAM: CHEST - 2 VIEW COMPARISON:  None. FINDINGS: Small left pleural effusion with associated atelectasis. Mild interstitial pulmonary edema. Mild cardiomegaly. IMPRESSION: Mild cardiomegaly with small left pleural effusion and mild interstitial edema, likely CHF. Electronically Signed   By: Deatra RobinsonKevin  Herman M.D.   On: 12/21/2017 19:30   Medications:  . calcitRIOL  2 mcg Oral QODAY  . calcium acetate  1,334 mg Oral TID WC  . carvedilol  12.5 mg Oral BID WC  . Chlorhexidine Gluconate Cloth  6 each Topical Q0600  . darbepoetin (ARANESP) injection - DIALYSIS  200 mcg Intravenous Q Tue-HD  . doxazosin  4 mg Oral Daily  . heparin  5,000 Units Subcutaneous Q8H  . levothyroxine  125  mcg Oral QAC breakfast  . minoxidil  2.5 mg Oral BID  . multivitamin  1 tablet Oral QHS  . verapamil  180 mg Oral BID    [facility]. TTS, 2K/2.5Ca, Standard Heparin, L AVF  Assessment/Plan: 1. Dyspnea/Pulm edema: Much improved s/p 2 HD sessions here. Will be lowering dry wt to 85- 86kg (was 89.5 per pt). Ok for dc.  2. ESRD: Back to TTS schedule now. 3. HTN/volume: BP better, EDW challenged down to 86.4kg -  will lower on d/c.  4. Anemia: Hgb 8.3. Aranesp 200mcg given on 6/25. 5. Secondary hyperparathyroidism: Labs ok, continue Phoslo and calcitriol. 6. Hypothyroidism 7. Dispo: Ok for discharge from renal standpoint, next HD tomorrow at his outpatient clinic.  Ozzie HoyleKatie Stovall, PA-C 12/23/2017, 11:32 AM   [facility] Pager: [phone]  Pt seen, examined, agree w assess/plan as above with additions as indicated.  Vinson Moselleob Alma Muegge MD [facility] pager [phone]    cell [phone] 12/23/2017, 11:49 AM

## 2017-12-23 NOTE — Care Management Obs Status (Signed)
MEDICARE OBSERVATION STATUS NOTIFICATION   Patient Details  Name: [facility] MRN: [id number] Date of Birth: 04/18/1956   Medicare Observation Status Notification Given:  Yes(pt declined to sign stating he was told he was admitted  as inpatient)    Epifanio Leschesole, Loraine Freid Hudson, RN 12/23/2017, 12:04 PM

## 2017-12-30 ENCOUNTER — Ambulatory Visit
Admission: RE | Admit: 2017-12-30 | Discharge: 2017-12-30 | Disposition: A | Discharge status: Home / Self Care | Payer: Medicare Other | Source: Ambulatory Visit | Attending: Nephrology | Admitting: Nephrology

## 2017-12-30 ENCOUNTER — Other Ambulatory Visit: Payer: Self-pay | Admitting: Nephrology

## 2017-12-30 DIAGNOSIS — N186 End stage renal disease: Secondary | ICD-10-CM

## 2019-08-25 IMAGING — DX DG CHEST 2V
2 series · 2 of 2 positions shown · non-contrast
Comparison: 12/21/2017

CLINICAL DATA: End stage renal disease. Atrial fibrillation.
Hypertension.

EXAM:
CHEST - 2 VIEW

[dg chest 2 view (1 of 2)]
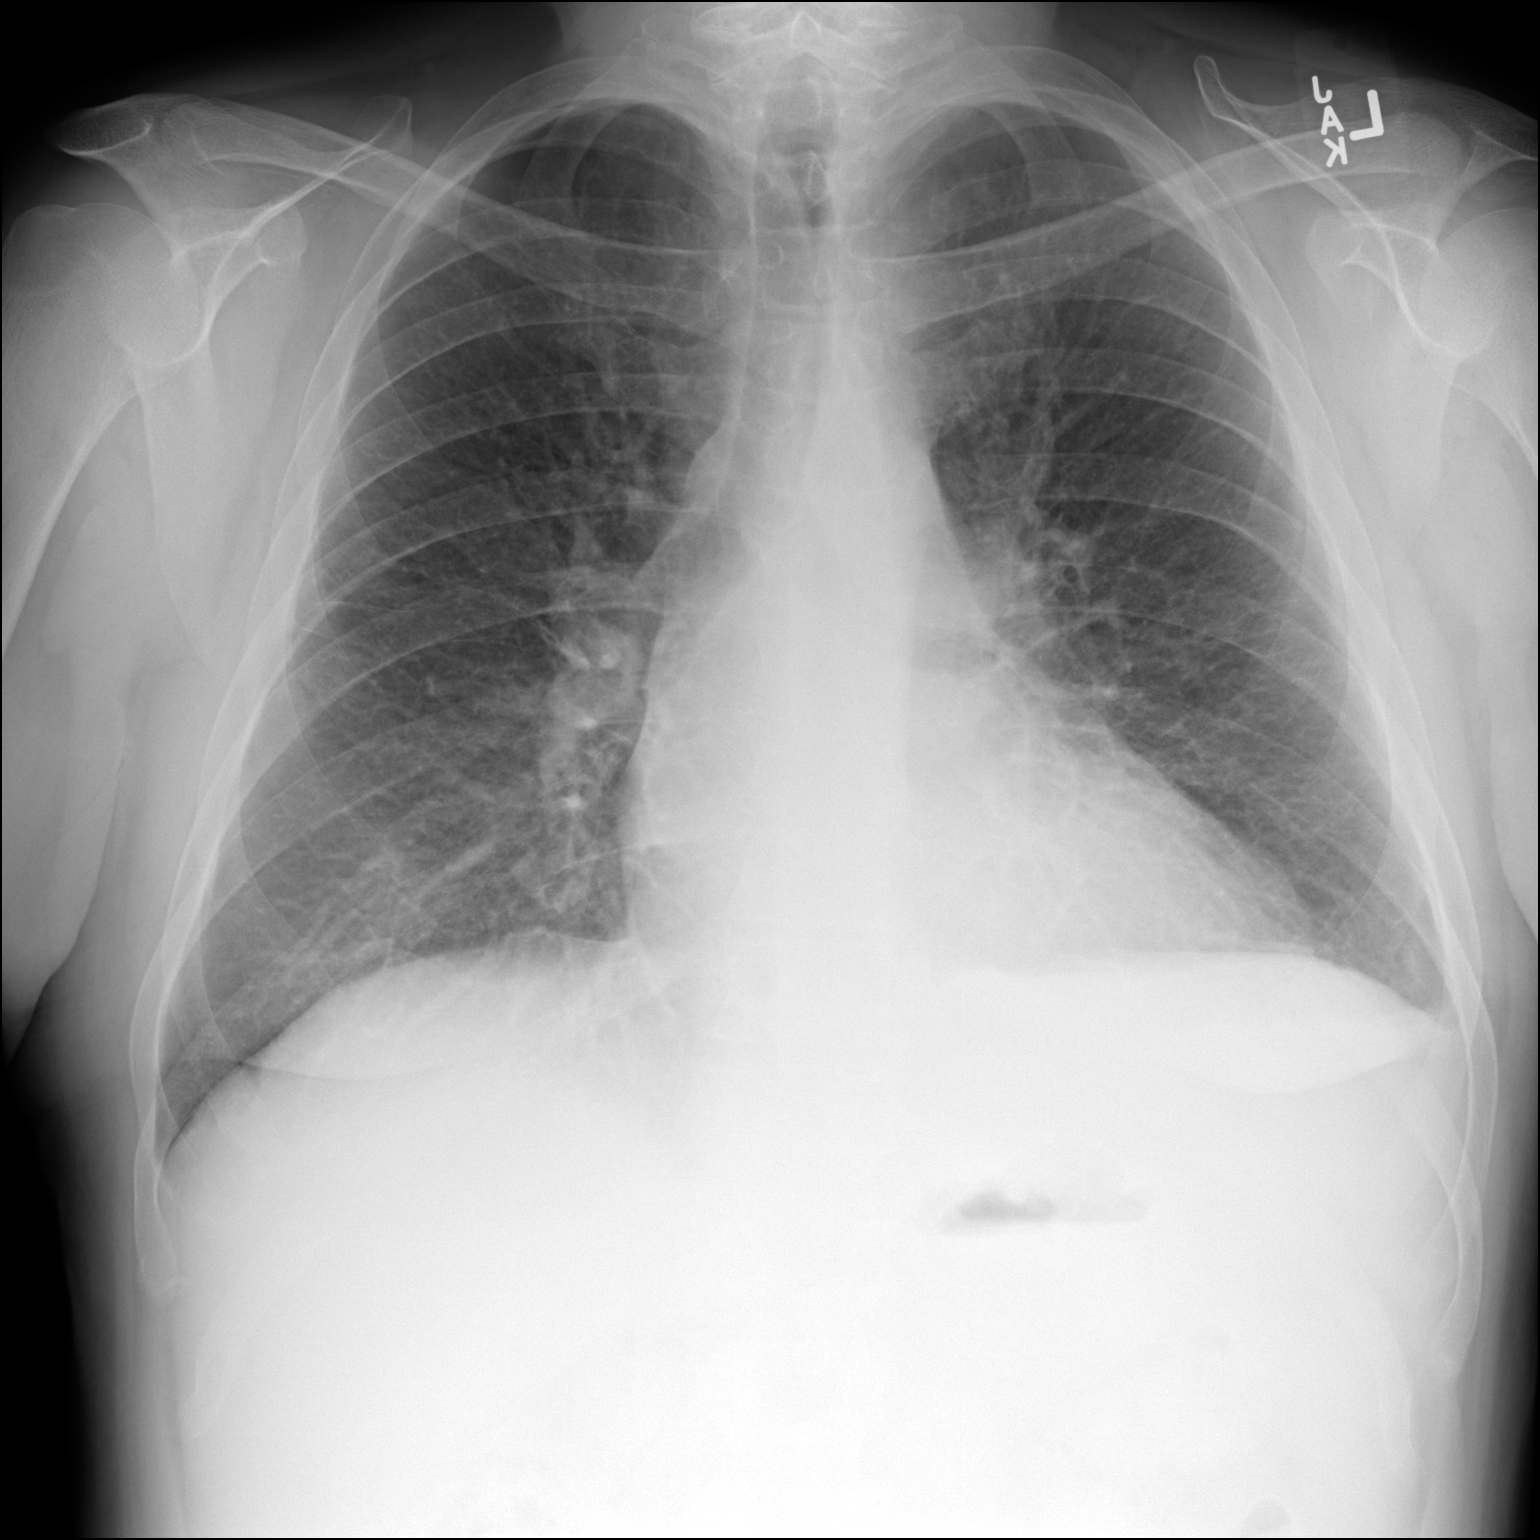

[dg chest 2 view (2 of 2)]
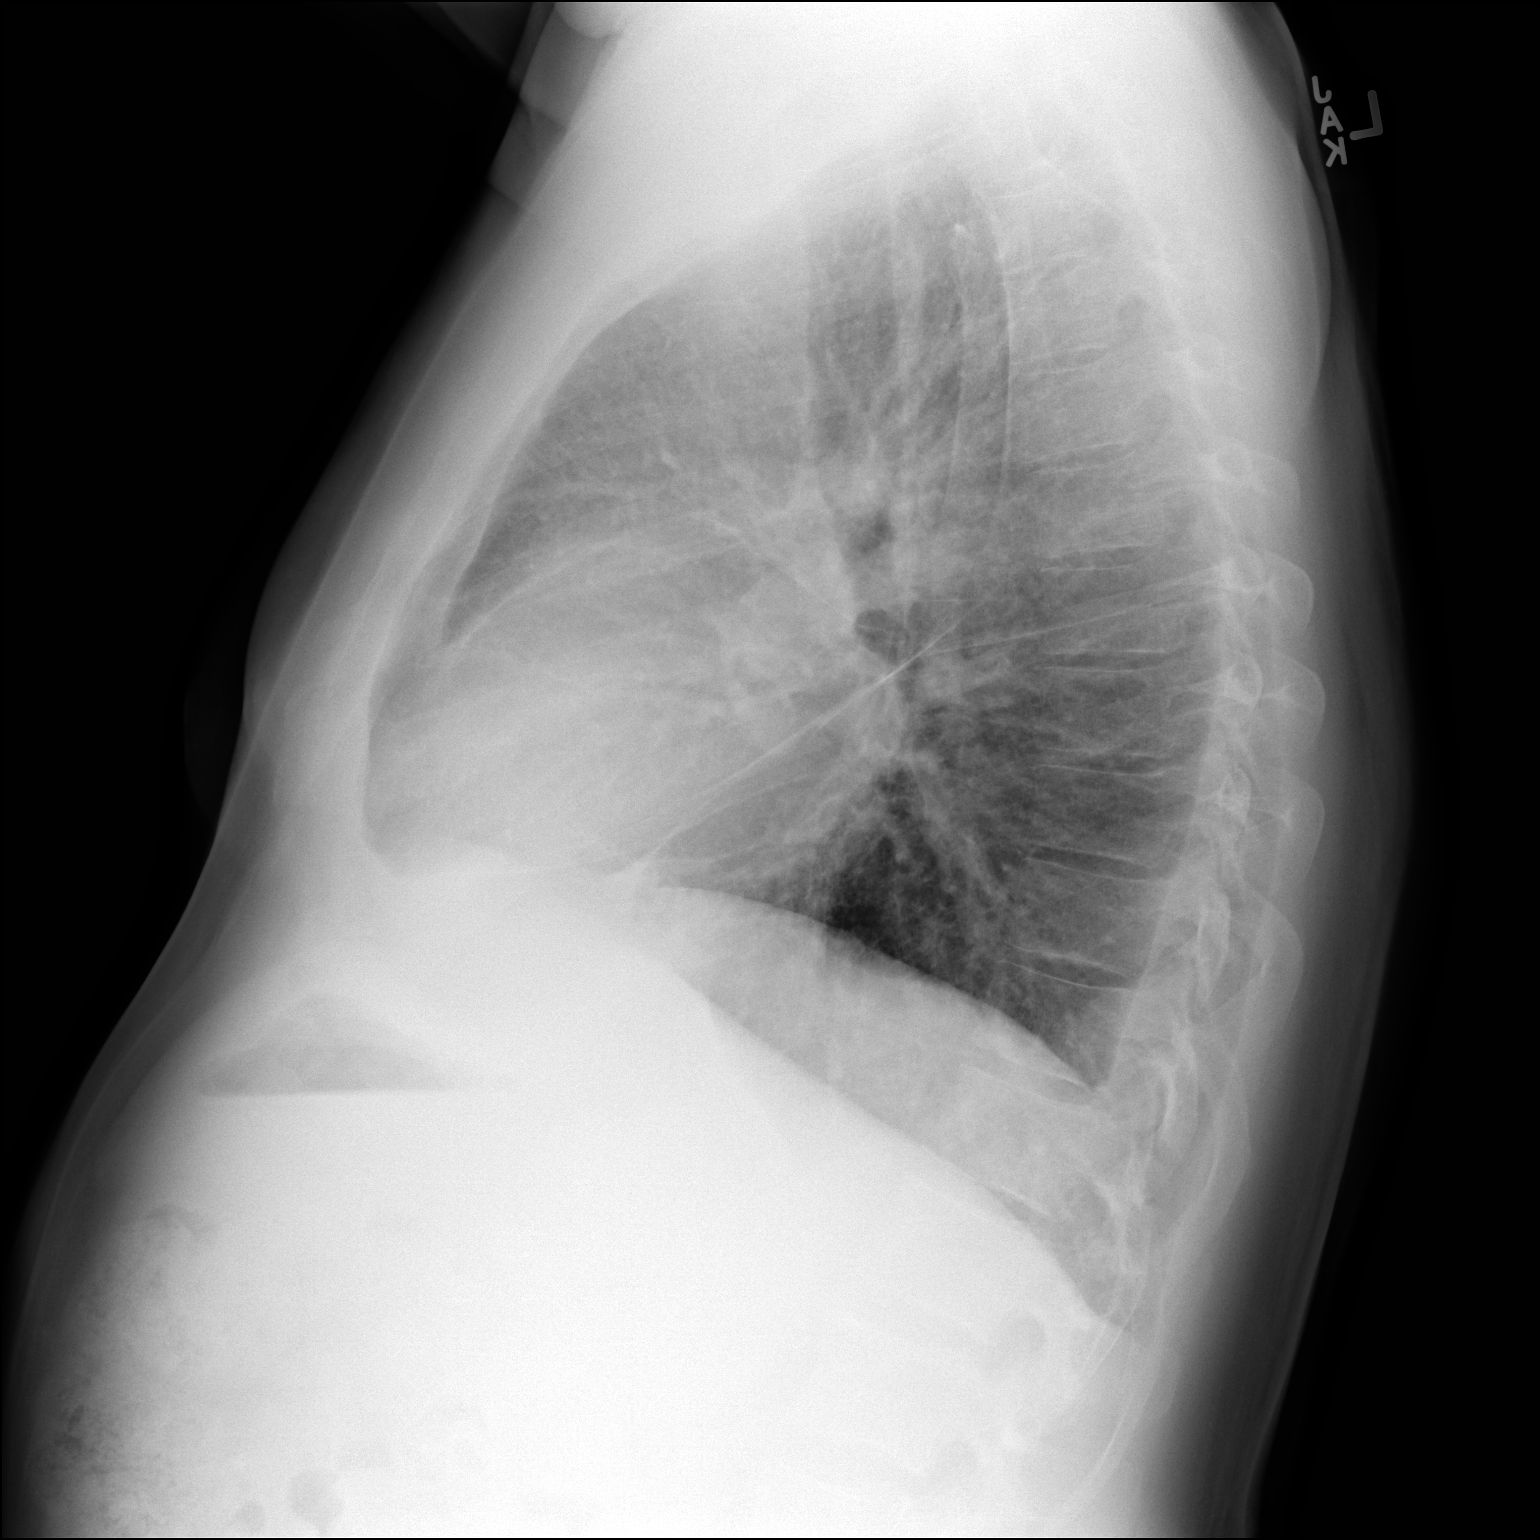

[2 of 2 positions shown; findings below may reference images not displayed]

FINDINGS: Mild cardiomegaly as seen previously. Mild venous hypertension
without frank edema. Small amount of fluid in the fissures. No
measurable effusions. Lungs are otherwise clear.
IMPRESSION: Radiographic improvement. Venous hypertension and small amount of
fluid in the fissures but no frank edema.

## 2020-03-30 DEATH — deceased
